# Patient Record
Sex: Female | Born: 1945 | Race: White | Hispanic: No | Smoking: Never smoker
Health system: Southern US, Community
[De-identification: ages and names within clinical notes are randomized; demographics above are authoritative.]

## PROBLEM LIST (undated history)

## (undated) DIAGNOSIS — F329 Major depressive disorder, single episode, unspecified: Secondary | ICD-10-CM

## (undated) DIAGNOSIS — E7800 Pure hypercholesterolemia, unspecified: Secondary | ICD-10-CM

## (undated) DIAGNOSIS — F419 Anxiety disorder, unspecified: Secondary | ICD-10-CM

## (undated) DIAGNOSIS — F32A Depression, unspecified: Secondary | ICD-10-CM

## (undated) HISTORY — PX: ABDOMINAL HYSTERECTOMY: SHX81

---

## 2012-07-31 ENCOUNTER — Emergency Department (HOSPITAL_COMMUNITY)
Admission: EM | Admit: 2012-07-31 | Discharge: 2012-07-31 | Disposition: A | Discharge status: Home / Self Care | Payer: Medicare Other | Attending: Emergency Medicine | Admitting: Emergency Medicine

## 2012-07-31 ENCOUNTER — Emergency Department (HOSPITAL_COMMUNITY): Payer: Medicare Other

## 2012-07-31 ENCOUNTER — Encounter (HOSPITAL_COMMUNITY): Payer: Self-pay | Admitting: Emergency Medicine

## 2012-07-31 ENCOUNTER — Inpatient Hospital Stay (HOSPITAL_COMMUNITY)
Admission: AD | Admit: 2012-07-31 | Discharge: 2012-08-03 | DRG: 897 | Disposition: A | Discharge status: Home / Self Care | Payer: Medicare Other | Source: Ambulatory Visit | Attending: Psychiatry | Admitting: Psychiatry

## 2012-07-31 DIAGNOSIS — G4700 Insomnia, unspecified: Secondary | ICD-10-CM | POA: Insufficient documentation

## 2012-07-31 DIAGNOSIS — F329 Major depressive disorder, single episode, unspecified: Secondary | ICD-10-CM | POA: Insufficient documentation

## 2012-07-31 DIAGNOSIS — F1010 Alcohol abuse, uncomplicated: Secondary | ICD-10-CM

## 2012-07-31 DIAGNOSIS — F39 Unspecified mood [affective] disorder: Secondary | ICD-10-CM | POA: Insufficient documentation

## 2012-07-31 DIAGNOSIS — F32A Depression, unspecified: Secondary | ICD-10-CM

## 2012-07-31 DIAGNOSIS — F3289 Other specified depressive episodes: Secondary | ICD-10-CM | POA: Insufficient documentation

## 2012-07-31 DIAGNOSIS — Z7982 Long term (current) use of aspirin: Secondary | ICD-10-CM

## 2012-07-31 DIAGNOSIS — E7800 Pure hypercholesterolemia, unspecified: Secondary | ICD-10-CM | POA: Insufficient documentation

## 2012-07-31 DIAGNOSIS — Z79899 Other long term (current) drug therapy: Secondary | ICD-10-CM

## 2012-07-31 DIAGNOSIS — F1020 Alcohol dependence, uncomplicated: Principal | ICD-10-CM | POA: Diagnosis present

## 2012-07-31 DIAGNOSIS — R45851 Suicidal ideations: Secondary | ICD-10-CM | POA: Insufficient documentation

## 2012-07-31 DIAGNOSIS — F411 Generalized anxiety disorder: Secondary | ICD-10-CM | POA: Insufficient documentation

## 2012-07-31 HISTORY — DX: Anxiety disorder, unspecified: F41.9

## 2012-07-31 HISTORY — DX: Major depressive disorder, single episode, unspecified: F32.9

## 2012-07-31 HISTORY — DX: Pure hypercholesterolemia, unspecified: E78.00

## 2012-07-31 HISTORY — DX: Depression, unspecified: F32.A

## 2012-07-31 LAB — POCT I-STAT, CHEM 8
Calcium, Ion: 1.09 mmol/L — ABNORMAL LOW (ref 1.13–1.30)
Chloride: 108 mEq/L (ref 96–112)
Glucose, Bld: 99 mg/dL (ref 70–99)
HCT: 39 % (ref 36.0–46.0)

## 2012-07-31 LAB — RAPID URINE DRUG SCREEN, HOSP PERFORMED
Amphetamines: NOT DETECTED
Benzodiazepines: NOT DETECTED
Cocaine: NOT DETECTED
Opiates: NOT DETECTED
Tetrahydrocannabinol: NOT DETECTED

## 2012-07-31 LAB — CBC WITH DIFFERENTIAL/PLATELET
Basophils Relative: 1 % (ref 0–1)
Hemoglobin: 13.3 g/dL (ref 12.0–15.0)
Lymphocytes Relative: 37 % (ref 12–46)
Lymphs Abs: 2.4 10*3/uL (ref 0.7–4.0)
Monocytes Relative: 7 % (ref 3–12)
Neutro Abs: 3.5 10*3/uL (ref 1.7–7.7)
Neutrophils Relative %: 55 % (ref 43–77)
RBC: 4.25 MIL/uL (ref 3.87–5.11)
WBC: 6.4 10*3/uL (ref 4.0–10.5)

## 2012-07-31 MED ORDER — ONDANSETRON HCL 8 MG PO TABS: 4.0000 mg | ORAL_TABLET | Freq: Three times a day (TID) | ORAL | Status: DC | PRN

## 2012-07-31 MED ORDER — SERTRALINE HCL 50 MG PO TABS
50.0000 mg | ORAL_TABLET | Freq: Every day | ORAL | Status: DC
  Filled 2012-07-31 (×3): qty 1

## 2012-07-31 MED ORDER — ALUM & MAG HYDROXIDE-SIMETH 200-200-20 MG/5ML PO SUSP: 30.0000 mL | ORAL | Status: DC | PRN

## 2012-07-31 MED ORDER — ONDANSETRON 4 MG PO TBDP: 4.0000 mg | ORAL_TABLET | Freq: Four times a day (QID) | ORAL | Status: DC | PRN

## 2012-07-31 MED ORDER — CHLORDIAZEPOXIDE HCL 25 MG PO CAPS: 25.0000 mg | ORAL_CAPSULE | Freq: Every day | ORAL | Status: DC

## 2012-07-31 MED ORDER — MAGNESIUM HYDROXIDE 400 MG/5ML PO SUSP: 30.0000 mL | Freq: Every day | ORAL | Status: DC | PRN

## 2012-07-31 MED ORDER — ACETAMINOPHEN 325 MG PO TABS
650.0000 mg | ORAL_TABLET | Freq: Four times a day (QID) | ORAL | Status: DC | PRN
  Administered 2012-08-01: 650 mg via ORAL

## 2012-07-31 MED ORDER — CHLORDIAZEPOXIDE HCL 25 MG PO CAPS
25.0000 mg | ORAL_CAPSULE | Freq: Four times a day (QID) | ORAL | Status: AC
  Administered 2012-08-01 – 2012-08-02 (×5): 25 mg via ORAL
  Filled 2012-07-31 (×4): qty 1

## 2012-07-31 MED ORDER — ASPIRIN 81 MG PO CHEW
324.0000 mg | CHEWABLE_TABLET | Freq: Once | ORAL | Status: AC
  Administered 2012-07-31: 324 mg via ORAL
  Filled 2012-07-31: qty 3
  Filled 2012-07-31: qty 1
  Filled 2012-07-31: qty 4

## 2012-07-31 MED ORDER — CHLORDIAZEPOXIDE HCL 25 MG PO CAPS
25.0000 mg | ORAL_CAPSULE | Freq: Once | ORAL | Status: AC
  Administered 2012-07-31: 25 mg via ORAL
  Filled 2012-07-31: qty 1

## 2012-07-31 MED ORDER — LORAZEPAM 1 MG PO TABS
1.0000 mg | ORAL_TABLET | Freq: Three times a day (TID) | ORAL | Status: DC | PRN
  Administered 2012-07-31 (×2): 1 mg via ORAL
  Filled 2012-07-31 (×2): qty 1

## 2012-07-31 MED ORDER — LOPERAMIDE HCL 2 MG PO CAPS: 2.0000 mg | ORAL_CAPSULE | ORAL | Status: DC | PRN

## 2012-07-31 MED ORDER — CHLORDIAZEPOXIDE HCL 25 MG PO CAPS
25.0000 mg | ORAL_CAPSULE | Freq: Three times a day (TID) | ORAL | Status: AC
  Administered 2012-08-02 (×3): 25 mg via ORAL
  Filled 2012-07-31 (×3): qty 1

## 2012-07-31 MED ORDER — CHLORDIAZEPOXIDE HCL 25 MG PO CAPS
25.0000 mg | ORAL_CAPSULE | ORAL | Status: DC
  Administered 2012-08-03: 25 mg via ORAL
  Filled 2012-07-31: qty 1

## 2012-07-31 MED ORDER — ACETAMINOPHEN 325 MG PO TABS: 650.0000 mg | ORAL_TABLET | ORAL | Status: DC | PRN

## 2012-07-31 MED ORDER — THIAMINE HCL 100 MG/ML IJ SOLN: 100.0000 mg | Freq: Once | INTRAMUSCULAR | Status: DC

## 2012-07-31 MED ORDER — VITAMIN B-1 100 MG PO TABS
100.0000 mg | ORAL_TABLET | Freq: Every day | ORAL | Status: DC
  Administered 2012-08-01 – 2012-08-03 (×3): 100 mg via ORAL
  Filled 2012-07-31 (×4): qty 1

## 2012-07-31 MED ORDER — HYDROXYZINE HCL 25 MG PO TABS
25.0000 mg | ORAL_TABLET | Freq: Four times a day (QID) | ORAL | Status: DC | PRN
  Administered 2012-08-02: 25 mg via ORAL

## 2012-07-31 MED ORDER — NICOTINE 21 MG/24HR TD PT24
21.0000 mg | MEDICATED_PATCH | Freq: Every day | TRANSDERMAL | Status: DC
  Filled 2012-07-31: qty 1

## 2012-07-31 MED ORDER — ADULT MULTIVITAMIN W/MINERALS CH
1.0000 | ORAL_TABLET | Freq: Every day | ORAL | Status: DC
  Administered 2012-08-01 – 2012-08-03 (×3): 1 via ORAL
  Filled 2012-07-31 (×4): qty 1

## 2012-07-31 MED ORDER — TRAZODONE HCL 50 MG PO TABS
50.0000 mg | ORAL_TABLET | Freq: Every evening | ORAL | Status: DC | PRN
  Administered 2012-07-31 – 2012-08-02 (×3): 50 mg via ORAL
  Filled 2012-07-31 (×9): qty 1

## 2012-07-31 MED ORDER — CHLORDIAZEPOXIDE HCL 25 MG PO CAPS
25.0000 mg | ORAL_CAPSULE | Freq: Four times a day (QID) | ORAL | Status: DC | PRN
Start: 2012-07-31 — End: 2012-08-03

## 2012-07-31 MED ORDER — IBUPROFEN 200 MG PO TABS: 600.0000 mg | ORAL_TABLET | Freq: Three times a day (TID) | ORAL | Status: DC | PRN

## 2012-07-31 NOTE — ED Provider Notes (Signed)
History     CSN: [id number]  Arrival date & time 07/31/12  0023   First MD Initiated Contact with Patient 07/31/12 0032      Chief Complaint  Patient presents with  . Chest Pain    (Consider location/radiation/quality/duration/timing/severity/associated sxs/prior treatment) Patient is a 66 y.o. female presenting with chest pain and mental health disorder. The history is provided by the patient. No language interpreter was used.  Chest Pain The chest pain began 1 - 2 weeks ago. Chest pain occurs constantly. The chest pain is unchanged. The severity of the pain is severe. The quality of the pain is described as dull. The pain radiates to the left shoulder. Exacerbated by: none. Pertinent negatives for primary symptoms include no shortness of breath, no cough, no wheezing and no nausea.  Pertinent negatives for associated symptoms include no claudication and no diaphoresis. She tried nothing for the symptoms. Risk factors include being elderly.  Pertinent negatives for past medical history include no MI and no seizures.  Procedure history is negative for cardiac catheterization.    Mental Health Problem The primary symptoms include dysphoric mood. Episode onset: unknown.  The onset of the illness is precipitated by a stressful event (taking care of husband). Additional symptoms of the illness include insomnia. Additional symptoms of the illness do not include no appetite change or no seizures. She admits to suicidal ideas. She contemplates harming herself. She has not already injured self. She does not contemplate injuring another person. She has not already  injured another person. Risk factors that are present for mental illness include a history of mental illness and substance abuse.  Told EMS she wanted to hurt self and pain got worse when on phone with daughter at EMS arrival and the daughters were reportedly concerned about her mental health.  Admits to drinking 2/5 of vodka on Sunday.   States now she is fine and wants to go home   Past Medical History  Diagnosis Date  . High cholesterol   . Depression   . Anxiety     Past Surgical History  Procedure Date  . Abdominal hysterectomy     No family history on file.  History  Substance Use Topics  . Smoking status: Not on file  . Smokeless tobacco: Not on file  . Alcohol Use:     OB History    Grav Para Term Preterm Abortions TAB SAB Ect Mult Living                  Review of Systems  Constitutional: Negative for diaphoresis and appetite change.  Respiratory: Negative for cough, shortness of breath and wheezing.   Cardiovascular: Positive for chest pain. Negative for claudication.  Gastrointestinal: Negative for nausea.  Neurological: Negative for seizures.  Psychiatric/Behavioral: Positive for dysphoric mood. The patient has insomnia.   All other systems reviewed and are negative.    Allergies  Penicillins  Home Medications   Current Outpatient Rx  Name  Route  Sig  Dispense  Refill  . ASPIRIN 81 MG PO CHEW   Oral   Chew 324 mg by mouth once.           There were no vitals taken for this visit.  Physical Exam  Constitutional: She is oriented to person, place, and time. She appears well-developed and well-nourished.  HENT:  Head: Normocephalic and atraumatic.  Mouth/Throat: Oropharynx is clear and moist.  Eyes: Conjunctivae normal are normal. Pupils are equal, round, and reactive to light.  Neck: Normal range of motion. Neck supple.  Cardiovascular: Normal rate and regular rhythm.   Pulmonary/Chest: Effort normal and breath sounds normal. She has no wheezes. She has no rales.  Abdominal: Soft. Bowel sounds are normal. There is no tenderness. There is no rebound and no guarding.  Musculoskeletal: Normal range of motion.  Neurological: She is alert and oriented to person, place, and time.  Skin: Skin is warm and dry. She is not diaphoretic.  Psychiatric: Her mood appears anxious. Her  affect is labile.    ED Course  Procedures (including critical care time)   Labs Reviewed  CBC WITH DIFFERENTIAL  ETHANOL  URINE RAPID DRUG SCREEN (HOSP PERFORMED)   No results found.   No diagnosis found.    MDM   Date: 07/31/2012  Rate: 91  Rhythm: normal sinus rhythm  QRS Axis: normal  Intervals: normal  ST/T Wave abnormalities: normal  Conduction Disutrbances: none  Narrative Interpretation: unremarkable     There is a lead in the position mentioned on chest xray, no indication for CT at this time  Seen by act team and patient needs admission       Marc Sivertsen K Domnic Vantol-Rasch, MD 07/31/12 7829

## 2012-07-31 NOTE — ED Notes (Signed)
Pt belongings inventoried and placed in [place] [address].

## 2012-07-31 NOTE — BH Assessment (Signed)
Assessment Note   Wendy Becker is an 66 y.o. female. Pt came to [facility] complaining of chest pain but also reported to EMS that she was suicidal.  PT BAC currently 219 and her report changes throughout the assessment.  Pt admits to SI with thoughts of overdose.  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  Pt reports she never gets a break from this and has "no life" and this has resulted in her feeling quite depressed and currently suicidal.  Pt also reports significant alcohol use: states she has been drinking a fifth vodka per day for the past week but unable to provide more history than this.  Pt had a DUI last year.  Pt denies HI/AV.  Pt was initially quite open about her depression/SI but when told she would be admitted she reported she did not want inpt treatment and could not do this.  Pt intoxication appeared to cause her story to jump around some.  It was explained to pt that she could not leave due to the safety concerns.  Pt does report withdrawal symptoms related to her alcohol use.  Pt has been treated for alcohol several times, including last year at [facility].    Axis I: Major Depression, single episode and Substance Abuse Axis II: Deferred Axis III:  Past Medical History  Diagnosis Date  . High cholesterol   . Depression   . 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 High cholesterol   . Depression   . Anxiety     Past Surgical History  Procedure Date  . Abdominal hysterectomy     Family History: No family history on file.  Social History:  does not have a smoking history on file. She does not have any smokeless tobacco history on file. Her alcohol and drug histories not on file.  Additional Social History:  Alcohol / Drug Use History  of alcohol / drug use?: Yes Negative Consequences of Use: Financial;Legal;Personal relationships Withdrawal Symptoms: Nausea / Vomiting;Sweats Substance #1 Name of Substance 1: vodka 1 - Age of First Use: 38 1 - Amount (size/oz): fifth  1 - Frequency: daily 1 - Duration: 1 week 1 - Last Use / Amount: 11/16, 2 fifths  CIWA: CIWA-Ar BP: 146/85 mmHg Nausea and Vomiting: intermittent nausea with dry heaves Tactile Disturbances: none Tremor: no tremor Auditory Disturbances: not present Paroxysmal Sweats: three Visual Disturbances: moderate sensitivity Anxiety: three Headache, Fullness in Head: mild Agitation: normal activity Orientation and Clouding of Sensorium: oriented and can do serial additions CIWA-Ar Total: 15  COWS:    Allergies:  Allergies  Allergen Reactions  . Penicillins Rash    Home Medications:  (Not in a hospital admission)  OB/GYN Status:  No LMP recorded.  General Assessment Data Location of Assessment: [facility] ED ACT Assessment: Yes Living Arrangements: Other (Comment) (ex husband) Can pt return to current living arrangement?: Yes  Education Status Is patient currently in school?: No  Risk to self Suicidal Ideation: Yes-Currently Present Suicidal Intent: No Is patient at risk for suicide?: Yes Suicidal Plan?: Yes-Currently Present Specify Current Suicidal Plan: overdose Access to Means: Yes Specify Access to Suicidal Means: pills What has been your use of drugs/alcohol within the last 12 months?: current  use of alcohol Previous Attempts/Gestures: No Intentional Self Injurious Behavior: None Family Suicide History: No Recent stressful life event(s): Other (Comment) (providing 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 ex husband, has "no life.") Persecutory voices/beliefs?: No Depression: Yes Depression Symptoms: Despondent;Insomnia;Tearfulness;Isolating;Guilt;Loss of interest in usual pleasures;Feeling worthless/self pity;Feeling angry/irritable Substance abuse history  and/or treatment for substance abuse?: Yes Suicide prevention information given to non-admitted patients: Not applicable  Risk to Others Homicidal Ideation: No Thoughts of Harm to Others: No Current Homicidal Intent: No Current Homicidal Plan: No Access to Homicidal Means: No History of harm to others?: No Assessment of Violence: None Noted Does patient have access to weapons?: Yes (Comment) (guns in home) Criminal Charges Pending?: No Does patient have a court date: No  Psychosis Hallucinations: None noted Delusions: None noted  Mental Status Report Appear/Hygiene: Disheveled Eye Contact: Poor Motor Activity: Unremarkable Speech: Logical/coherent Level of Consciousness: Quiet/awake Mood: Other (Comment) (cooperative) Affect: Fearful Anxiety Level: Moderate Thought Processes: Relevant Judgement: Impaired (currently intoxicated) Orientation: Person;Place;Time;Situation Obsessive Compulsive Thoughts/Behaviors: None  Cognitive Functioning Concentration: Normal Memory: Recent Intact;Remote Intact IQ: Average Insight: Fair Impulse Control: Poor Appetite: Poor Weight Loss: 0  Weight Gain: 0  Sleep: Decreased Total Hours of Sleep: 5  Vegetative Symptoms: None  ADLScreening [facility] Assessment Services) Patient's cognitive ability adequate to safely complete daily activities?: Yes Patient able to express need for assistance with ADLs?: Yes Independently performs ADLs?: Yes (appropriate for developmental age)  Abuse/Neglect [facility]) Physical Abuse: Denies Verbal Abuse: Denies Sexual Abuse: Denies  Prior Inpatient Therapy Prior Inpatient Therapy: Yes Prior Therapy Dates: 2006 Prior Therapy Facilty/Provider(s): [place] Reason for Treatment: alcohol  Prior Outpatient Therapy Prior Outpatient Therapy: Yes Prior Therapy Dates: 2012 Prior Therapy Facilty/Provider(s): [facility] Reason for Treatment: DUI  ADL Screening (condition at time of admission) Patient's  cognitive ability adequate to safely complete daily activities?: Yes Patient able to express need for assistance with ADLs?: Yes Independently performs ADLs?: Yes (appropriate for developmental age) Weakness of Legs: None Weakness of Arms/Hands: None  Home Assistive Devices/Equipment Home Assistive Devices/Equipment: None    Abuse/Neglect Assessment (Assessment to be complete while patient is alone) Physical Abuse: Denies Verbal Abuse: Denies Sexual Abuse: Denies Exploitation of patient/patient's resources: Denies Self-Neglect: Denies Values / Beliefs Cultural Requests During Hospitalization: None Spiritual Requests During Hospitalization: None   Advance Directives (For Healthcare) Advance Directive: Patient does not have advance directive;Patient would like information    Additional Information 1:1 In Past 12 Months?: No CIRT Risk: No Elopement Risk: Yes Does patient have medical clearance?: Yes     Disposition: Discussed this pt with Dr Saralyn Pilar of [facility] who agrees pt requires inpt psych treatment at this point.  Pt info sent to [facility] for review. Disposition Disposition of Patient: Inpatient treatment program Type of inpatient treatment program: Adult  On Site Evaluation by:   Reviewed with Physician:     Lorri Frederick 07/31/2012 2:18 AM

## 2012-07-31 NOTE — ED Notes (Signed)
Patient transported to X-ray 

## 2012-07-31 NOTE — Progress Notes (Signed)
Patient ID: Wendy Becker, female   DOB: 1946/05/25, 66 y.o.   MRN: [id number] Patient is a 66 year old female who comes in for help with depression and ETOH abuse. She reports moving here from [place] three years ago and has not established a new social circle. 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 She expresses a strong desire to do this. Patient has been feeling overwhelmed and drinking ETOH to cope. Reports drinking uncertain amount of wine and fifth of vodka daily. She was very anxious during the admission and is preoccupied with who will be taking care of ex-husband. The patient has tremors, mild sweats, headache but denies GI upset. Received ativan prior to coming to [facility]. Patient reports only medical history is depression and high cholesterol. Quit zoloft several months ago due to it not working well for her. Patient endorses SI only when drinking. Denies currently and contracts for safety. Patient plans to find a church after d/c. Has two daughters who are very supportive of her. Patient oriented to the 300 hall unit and routine. Reports being inpatient at [facility] for depression in the past.

## 2012-07-31 NOTE — Tx Team (Signed)
Initial Interdisciplinary Treatment Plan  PATIENT STRENGTHS: (choose at least two) Ability for insight Average or above average intelligence Capable of independent living General fund of knowledge Supportive family/friends  PATIENT STRESSORS: Financial difficulties Marital or family conflict Substance abuse   PROBLEM LIST: Problem List/Patient Goals Date to be addressed Date deferred Reason deferred Estimated date of resolution  Depression      ETOH abuse      Risk for self harm                                           DISCHARGE CRITERIA:  Ability to meet basic life and health needs Improved stabilization in mood, thinking, and/or behavior Motivation to continue treatment in a less acute level of care Verbal commitment to aftercare and medication compliance Withdrawal symptoms are absent or subacute and managed without 24-hour nursing intervention  PRELIMINARY DISCHARGE PLAN: Attend aftercare/continuing care group Attend 12-step recovery group Outpatient therapy Return to previous living arrangement  PATIENT/FAMIILY INVOLVEMENT: This treatment plan has been presented to and reviewed with the patient, Wendy Becker, and/or family member.  The patient and family have been given the opportunity to ask questions and make suggestions.  Jesus Genera [facility] 07/31/2012, 11:53 PM

## 2012-07-31 NOTE — ED Notes (Signed)
Per EMS- Pt states she has been having chest for over a week. States that pain radiates to her L arm "feels like a pressure." Patient reports nausea and vomiting. Reports drinking two bottles of Vodka. Currently states she is not having any pain, pt is currently requesting to go home. Patient mentioned to EMS that she wanted to hurt herself but now denies this, states she was just joking. Alertx4, NAD.

## 2012-08-01 ENCOUNTER — Encounter (HOSPITAL_COMMUNITY): Payer: Self-pay | Admitting: Psychiatry

## 2012-08-01 DIAGNOSIS — F329 Major depressive disorder, single episode, unspecified: Secondary | ICD-10-CM

## 2012-08-01 DIAGNOSIS — F1994 Other psychoactive substance use, unspecified with psychoactive substance-induced mood disorder: Secondary | ICD-10-CM

## 2012-08-01 DIAGNOSIS — F1010 Alcohol abuse, uncomplicated: Secondary | ICD-10-CM

## 2012-08-01 LAB — URINALYSIS, ROUTINE W REFLEX MICROSCOPIC
Bilirubin Urine: NEGATIVE
Glucose, UA: NEGATIVE mg/dL
Hgb urine dipstick: NEGATIVE
Ketones, ur: 15 mg/dL — AB
Nitrite: NEGATIVE
pH: 8 (ref 5.0–8.0)

## 2012-08-01 LAB — HEPATIC FUNCTION PANEL
Albumin: 4.6 g/dL (ref 3.5–5.2)
Alkaline Phosphatase: 96 U/L (ref 39–117)
Total Bilirubin: 1.3 mg/dL — ABNORMAL HIGH (ref 0.3–1.2)

## 2012-08-01 LAB — URINE MICROSCOPIC-ADD ON

## 2012-08-01 LAB — TSH: TSH: 5.308 u[IU]/mL — ABNORMAL HIGH (ref 0.350–4.500)

## 2012-08-01 MED ORDER — ESCITALOPRAM OXALATE 10 MG PO TABS
5.0000 mg | ORAL_TABLET | Freq: Every day | ORAL | Status: DC
  Administered 2012-08-01 – 2012-08-03 (×3): 5 mg via ORAL
  Filled 2012-08-01 (×3): qty 0.5
  Filled 2012-08-01: qty 1
  Filled 2012-08-01: qty 0.5

## 2012-08-01 NOTE — Progress Notes (Signed)
[facility] Adult Inpatient Family/Significant Other Suicide Prevention Education  Suicide Prevention Education:  Education Completed; Kathrynn Humble - daughter [phone]),  (name of family member/significant other) has been identified by the patient as the family member/significant other with whom the patient will be residing, and identified as the person(s) who will aid the patient in the event of a mental health crisis (suicidal ideations/suicide attempt).  With written consent from the patient, the family member/significant other has been provided the following suicide prevention education, prior to the and/or following the discharge of the patient.  The suicide prevention education provided includes the following:  Suicide risk factors  Suicide prevention and interventions  National Suicide Hotline telephone number  [facility] assessment telephone number  [facility] Emergency Assistance 911  [facility] and/or Residential Mobile Crisis Unit telephone number  Request made of family/significant other to:  Remove weapons (e.g., guns, rifles, knives), all items previously/currently identified as safety concern.    Remove drugs/medications (over-the-counter, prescriptions, illicit drugs), all items previously/currently identified as a safety concern.  The family member/significant other verbalizes understanding of the suicide prevention education information provided.  The family member/significant other agrees to remove the items of safety concern listed above.  Wendy Becker 08/01/2012, 10:31 AM

## 2012-08-01 NOTE — [facility] Suicide Risk Assessment (Signed)
Suicide Risk Assessment  Admission Assessment     Nursing information obtained from:  Patient Demographic factors:  Age 68 or older;Divorced or widowed;Caucasian Current Mental Status:  Suicidal ideation indicated by patient Loss Factors:  Decrease in vocational status Historical Factors:  NA Risk Reduction Factors:  Sense of responsibility to family;Religious beliefs about death;Positive therapeutic relationship  CLINICAL FACTORS:   Depression:   Comorbid alcohol abuse/dependence Alcohol/Substance Abuse/Dependencies  COGNITIVE FEATURES THAT CONTRIBUTE TO RISK: No evidence   SUICIDE RISK:   Mild:  Suicidal ideation of limited frequency, intensity, duration, and specificity.  There are no identifiable plans, no associated intent, mild dysphoria and related symptoms, good self-control (both objective and subjective assessment), few other risk factors, and identifiable protective factors, including available and accessible social support.  PLAN OF CARE: Detox                              Reassess co morbidities                              Coping skills/stress management/relapse prevention   Reggie Bise A 08/01/2012, 8:34 AM

## 2012-08-01 NOTE — Clinical Social Work Note (Addendum)
Aftercare Planning Group: 08/01/2012 9:45 AM  Pt attended discharge planning group and actively participated in group.  CSW provided pt with today's workbook.  Pt presents with flat affect and depressed mood.  Pt denies having depression and anxiety today.  Pt denies SI/HI.  Pt states that she came to the hospital for depression and alcohol use.  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  Pt states that she doesn't have a psychiatrist or therapist in the area.  CSW will assess for appropriate referrals.  No further needs voiced by pt at this time.  Safety planning and suicide prevention discussed.  Pt participated in discussion and acknowledged an understanding of the information provided.       [facility] Group Note : Clinical Social Worker Group Therapy  08/01/2012  1:15 PM  Type of Therapy:  Group Therapy - Process Group  Participation Level:  Appropriate  Participation Quality:  Appropriate   Affect:  Appropriate  Cognitive:  Alert  Insight:  Good  Engagement in Group:  Good  Engagement in Therapy:  Good  Modes of Intervention:  Clarification, Education, Orientation, Problem-solving, Socialization and Support  Summary of Progress/Problems: Patient was attentive and engaged with speaker from [employer].  Patient expressed interest in their programs and services.  Patient processed ways they can relate to the speaker.   Pt discussed that this would be a good option for her for support vs AA.     Murle Hellstrom Horton, LCSWA 08/01/2012 3:00 pm

## 2012-08-01 NOTE — H&P (Signed)
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 She was in [place] for 7 years, moved back to help him out. Has bee too much. SHe does everything for him. She has suffered from depression, has been on Zoloft. She quit it as she felt it was not helping. She got "too stressed." Started drinking more to cope. She experienced increased anxiety, having some physical symptoms, (panic like,) she came to the ED. She was assessed at the ED, says she admitted she has thought about killing herself Mood Symptoms:  Depression, Depression Symptoms:  depressed mood, anhedonia, insomnia, anxiety, insomnia, disturbed sleep, (Hypo) Manic Symptoms:  Denies Anxiety Symptoms:  Excessive Worry, Psychotic Symptoms:  Denies  PTSD Symptoms: Denies   Past Psychiatric History: Diagnosis:Alcohol Abuse/Dependence , Depressive Disorder NOS  Hospitalizations: 2006 in [place]. 30 day  Outpatient Care: [facility]  Substance Abuse Care: [facility]  Self-Mutilation:Denies  Suicidal Attempts:Denies  Violent Behaviors:Denies   Past Medical History:   Past Medical History  Diagnosis Date  . High cholesterol   . Depression   . Anxiety    None. Allergies:   Allergies  Allergen Reactions  . Penicillins Rash   PTA Medications: Prescriptions prior to admission  Medication Sig Dispense Refill  . aspirin 81 MG chewable tablet Chew 324 mg by mouth once.      . diphenhydrAMINE (SOMINEX) 25 MG tablet Take 25 mg by mouth at bedtime.      Marland Kitchen MELATONIN PO Take 3-4 tablets by mouth at bedtime.      . sertraline (ZOLOFT) 100 MG tablet Take 50 mg by mouth daily.        Previous Psychotropic Medications:  Medication/Dose  Zoloft  Lexapro  temazepam           Substance Abuse History in the  last 12 months: Substance Age of 1st Use Last Use Amount Specific Type  Nicotine      Alcohol 38 Binge cant have a glass of wine, Saturday  wine  Cannabis      Opiates      Cocaine      Methamphetamines      LSD      Ecstasy      Benzodiazepines      Caffeine      Inhalants      Others:                         Consequences of Substance Abuse: Legal Consequences:  2 DWI  Social History: Current Place of Residence:   Place of Birth:   Family Members: Marital Status:  Divorced Children:  Sons:1  Daughters:2 Relationships: Education:  HS Physiological scientist client care Religious Beliefs/Practices: History of Abuse (Emotional/Phsycial/Sexual) Occupational Experiences; Military History:  None. Legal History: Hobbies/Interests:  Family History:  No family history on file. Brother Addictions  Mental Status Examination/Evaluation: Objective:  Appearance: Casual  Eye Contact::  Good  Speech:  Clear and Coherent and Normal Rate  Volume:  Normal  Mood:  Worry, Depression  Affect:  Appropriate  Thought Process:  Coherent and Goal Directed  Orientation:  Full  Thought Content:  WDL  Suicidal Thoughts:  Has had thoughts, no plans, no intent  Homicidal Thoughts:  No  Memory:  Immediate;  Fair Recent;   Fair Remote;   Fair  Judgement:  Fair  Insight:  Fair  Psychomotor Activity:  Normal  Concentration:  Fair  Recall:  Fair  Akathisia:  No  Handed:  Right  AIMS (if indicated):     Assets:  Communication Skills Desire for Improvement Financial Resources/Insurance Housing Social Support Transportation  Sleep:  Number of Hours: 6     Laboratory/X-Ray Psychological Evaluation(s)      Assessment:    AXIS I:  Alcohol Abuse, R/O Dependence, Substance Induced mood disorder, Depressive Disorder NOS AXIS II:  Deferred AXIS III:   Past Medical History  Diagnosis Date  . High cholesterol   . Depression   . Anxiety      AXIS IV:  Demand from taking care of her ex-husband AXIS V:  51-60 moderate symptoms  Treatment Plan/Recommendations:  Treatment Plan Summary: Daily contact with patient to assess and evaluate symptoms and progress in treatment Medication management Detox, reassess mood,  Treat the depression depression Current Medications:  Current Facility-Administered Medications  Medication Dose Route Frequency Provider Last Rate Last Dose  . acetaminophen (TYLENOL) tablet 650 mg  650 mg Oral Q6H PRN Kerry Hough, PA      . alum & mag hydroxide-simeth (MAALOX/MYLANTA) 200-200-20 MG/5ML suspension 30 mL  30 mL Oral Q4H PRN Kerry Hough, PA      . [COMPLETED] aspirin chewable tablet 324 mg  324 mg Oral Once Kerry Hough, PA   324 mg at 07/31/12 2138  . chlordiazePOXIDE (LIBRIUM) capsule 25 mg  25 mg Oral Q6H PRN Kerry Hough, PA      . [COMPLETED] chlordiazePOXIDE (LIBRIUM) capsule 25 mg  25 mg Oral Once Kerry Hough, PA   25 mg at 07/31/12 2133  . chlordiazePOXIDE (LIBRIUM) capsule 25 mg  25 mg Oral QID Kerry Hough, PA       Followed by  . chlordiazePOXIDE (LIBRIUM) capsule 25 mg  25 mg Oral TID Kerry Hough, PA       Followed by  . chlordiazePOXIDE (LIBRIUM) capsule 25 mg  25 mg Oral BH-qamhs Kerry Hough, PA       Followed by  . chlordiazePOXIDE (LIBRIUM) capsule 25 mg  25 mg Oral Daily Kerry Hough, PA      . hydrOXYzine (ATARAX/VISTARIL) tablet 25 mg  25 mg Oral Q6H PRN Kerry Hough, PA      . loperamide (IMODIUM) capsule 2-4 mg  2-4 mg Oral PRN Kerry Hough, PA      . magnesium hydroxide (MILK OF MAGNESIA) suspension 30 mL  30 mL Oral Daily PRN Kerry Hough, PA      . multivitamin with minerals tablet 1 tablet  1 tablet Oral Daily Kerry Hough, PA      . ondansetron (ZOFRAN-ODT) disintegrating tablet 4 mg  4 mg Oral Q6H PRN Kerry Hough, PA      . sertraline (ZOLOFT) tablet 50 mg  50 mg Oral Daily Kerry Hough, PA      . thiamine (B-1) injection  100 mg  100 mg Intramuscular Once Kerry Hough, PA      . thiamine (VITAMIN B-1) tablet 100 mg  100 mg Oral Daily Kerry Hough, PA      . traZODone (DESYREL) tablet 50 mg  50 mg Oral QHS,MR X 1 Kerry Hough, PA   50 mg at 07/31/12 2133  . [DISCONTINUED] nicotine (NICODERM CQ - dosed  in mg/24 hours) patch 21 mg  21 mg Transdermal Q0600 Kerry Hough, PA       Facility-Administered Medications Ordered in Other Encounters  Medication Dose Route Frequency Provider Last Rate Last Dose  . [DISCONTINUED] acetaminophen (TYLENOL) tablet 650 mg  650 mg Oral Q4H PRN April K Palumbo-Rasch, MD      . [DISCONTINUED] alum & mag hydroxide-simeth (MAALOX/MYLANTA) 200-200-20 MG/5ML suspension 30 mL  30 mL Oral PRN April K Palumbo-Rasch, MD      . [DISCONTINUED] ibuprofen (ADVIL,MOTRIN) tablet 600 mg  600 mg Oral Q8H PRN April K Palumbo-Rasch, MD      . [DISCONTINUED] LORazepam (ATIVAN) tablet 1 mg  1 mg Oral Q8H PRN April K Palumbo-Rasch, MD   1 mg at 07/31/12 1237  . [DISCONTINUED] ondansetron (ZOFRAN) tablet 4 mg  4 mg Oral Q8H PRN April K Palumbo-Rasch, MD        Observation Level/Precautions:  AWOL  Laboratory:  As per ED  Psychotherapy:  Individual/ Group Therapy/Coping skills  Medications:  Librium Detox  Routine PRN Medications:  Yes  Consultations:    Discharge Concerns:    Other:     Romeka Scifres A 11/19/20138:05 AM

## 2012-08-01 NOTE — Progress Notes (Signed)
Pt is new admit to the hall for detox from ETOH.  Pt reports she went to the ED for chest pain, but admitted to staff that she was depressed and was having some passive thoughts to kill herself.  She also was intoxicated and admitted to drinking daily.  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  She says she really doesn't have a social life and has not met any new people since moving here from [place] a few years ago to take care of her ex.  She presents as flat/sad.  Pt denies SI/HI/AV at this time.  She denies feeling any withdrawal symptoms at this time, but her BP is elevated.  She was started on the Librium protocol and given Trazodone for sleep.  Pt encouraged to make his needs known to staff.  Safety maintained with q15 minute checks.

## 2012-08-01 NOTE — Progress Notes (Signed)
D: Patient denies SI/HI and A/V hallucinations; patient reports that she requested medication for sleep; reports appetite to be improving ; reports energy level is low; reports ability to pay attention to be improving; rates depression as 7/10; rates hopelessness 8/10; denies any anxiety  A: Monitored q 15 minutes; patient encouraged to attend groups; patient educated about medications; patient given medications per physician orders; patient encouraged to express feelings and/or concerns;librium protocol; lexapro started per physician order  R: Patient is cooperative and animated.; patient's interaction with staff and peers is appropriate;patient is taking medications as prescribed and tolerating medications; patient is attending all groups

## 2012-08-01 NOTE — Progress Notes (Signed)
  [facility] Group Notes:  (Counselor/Nursing/MHT/Case Management/Adjunct)  08/01/2012 12:32 PM  Type of Therapy:  Psychoeducational Skills  Participation Level:  Minimal  Participation Quality:  Appropriate and Attentive  Affect:  Appropriate and Blunted  Cognitive:  Appropriate and Oriented  Insight:  Good  Engagement in Group:  Good  Engagement in Therapy:  n/a  Modes of Intervention:  Activity, Education, Problem-solving, Socialization and Support  Summary of Progress/Problems: Janie attended a psycho-education group that focused on using quality time with support systems/individuals to engage in health coping skills. Janie participated in activity guessing about self and peers. Wille Celeste was quiet but attentive while group discussed who their supports are, how they can spend positive quality time with them as a coping skills and a way to strengthen their relationship. Wille Celeste was given a homework assignment to find two ways to improve her support systems and twenty activities she can do to spend quality time with her supports.    Wandra Scot 08/01/2012, 12:32 PM

## 2012-08-01 NOTE — [facility] Counselor (Signed)
Adult Comprehensive Assessment  Patient ID: Wendy Becker, female   DOB: Aug 25, 1946, 66 y.o.   MRN: [id number]  Information Source: Information source: Patient  Current Stressors:  Educational / Learning stressors: N/A Employment / Job issues: N/A Family Relationships: Living with ex husband and caretaker for him Surveyor, quantity / Lack of resources (include bankruptcy): N/A Housing / Lack of housing: N/A Physical health (include injuries & life threatening diseases): N/A Social relationships: N/A Substance abuse: Alcohol Use Bereavement / Loss: N/A  Living/Environment/Situation:  Living Arrangements: Other (Comment) (ex husband) Living conditions (as described by patient or guardian): Pt states that it can be 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 How long has patient lived in current situation?: 2.5 years What is atmosphere in current home: Comfortable;Other (Comment) (Stressful at times)  Family History:  Marital status: Divorced What types of issues is patient dealing with in the relationship?: Husband not supportive Additional relationship information: Live together now and is a caretaker for him Does patient have children?: Yes How many children?: 3  How is patient's relationship with their children?: Pt states that she has a great relationship with her children  Childhood History:  By whom was/is the patient raised?: Both parents Additional childhood history information: Pt states that she a good childhood and states that it was normal Description of patient's relationship with caregiver when they were a child: Pt states that she got along well with her parents Patient's description of current relationship with people who raised him/her: Pt states that parents are deceased Does patient have siblings?: No Did patient suffer any verbal/emotional/physical/sexual abuse as a child?: No Did patient suffer from severe childhood neglect?: No Has patient ever been  sexually abused/assaulted/raped as an adolescent or adult?: No Was the patient ever a victim of a crime or a disaster?: No Witnessed domestic violence?: No Has patient been effected by domestic violence as an adult?: No  Education:  Highest 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, molecular Currently a student?: No Learning disability?: No  Employment/Work Situation:   Employment situation: Unemployed Patient's job has been impacted by current illness: No What is the longest time patient has a held a job?: 6 years Where was the patient employed at that time?: [employer] Has patient ever been in the [employer]?: No Has patient ever served in Buyer, retail?: No  Financial Resources:   Surveyor, quantity resources: [employer];Medicare;Support from parents / caregiver Does patient have a representative payee or guardian?: No  Alcohol/Substance Abuse:   What has been your use of drugs/alcohol within the last 12 months?: Alcohol - pt states that she binges on a big bottle of wine or vodka occasionally If attempted suicide, did drugs/alcohol play a role in this?: No Alcohol/Substance Abuse Treatment Hx: Past Tx, Inpatient If yes, describe treatment: Rehab in [facility] ni 2006  Social Support System:   Patient's Community Support System: Good Describe Community Support System: Pt states that her children are supportive Type of faith/religion: Christian How does patient's faith help to cope with current illness?: Prayer and reading  Leisure/Recreation:   Leisure and Hobbies: Shopping  Strengths/Needs:   What things does the patient do well?: Taking care of people and talking with people In what areas does patient struggle / problems for patient: Depression and Alcohol Use  Discharge Plan:   Does patient have access to transportation?: Yes Will patient be returning to same living situation after discharge?: Yes Currently receiving community mental health services: No If no, would patient  like referral for services when discharged?: Yes (What county?) [facility]) Does patient have financial barriers related to discharge medications?: No  Summary/Recommendations:  Patient is a 66 year old Caucasian Female with a diagnosis of Alcohol Abuse and MDD.  Patient lives in [place] with her ex husband.  Patient will benefit from crisis stabilization, medication evaluation, group therapy and psycho education in addition to case management for discharge planning.      Horton, Salome Arnt. 08/01/2012

## 2012-08-01 NOTE — Progress Notes (Signed)
Psychoeducational Group Note  Date:  08/01/2012 Time:  1100  Group Topic/Focus:  Recovery Goals:   The focus of this group is to identify appropriate goals for recovery and establish a plan to achieve them.  Participation Level:  Active  Participation Quality:  Appropriate, Attentive, Sharing and Supportive  Affect:  Appropriate  Cognitive:  Appropriate  Insight:  Good  Engagement in Group:  Good  Additional Comments:  Pt participated in discussion about recovery goals and what changes she needs to make in order to make progress towards recovery. Pt was engaged in group stating she knows what goals she needs to make but would like to sit down later today to think about it.   Wendy Becker 08/01/2012, 2:42 PM

## 2012-08-02 DIAGNOSIS — F1020 Alcohol dependence, uncomplicated: Principal | ICD-10-CM

## 2012-08-02 MED ORDER — ASPIRIN 81 MG PO CHEW: 81.0000 mg | CHEWABLE_TABLET | Freq: Once | ORAL | Status: DC

## 2012-08-02 MED ORDER — TRAZODONE HCL 50 MG PO TABS: 50.0000 mg | ORAL_TABLET | Freq: Every evening | ORAL | Status: DC | PRN

## 2012-08-02 MED ORDER — ESCITALOPRAM OXALATE 5 MG PO TABS: 5.0000 mg | ORAL_TABLET | Freq: Every day | ORAL | Status: DC

## 2012-08-02 MED ORDER — HYDROXYZINE HCL 25 MG PO TABS: 25.0000 mg | ORAL_TABLET | Freq: Four times a day (QID) | ORAL | Status: DC | PRN

## 2012-08-02 MED ORDER — THIAMINE HCL 100 MG PO TABS: 100.0000 mg | ORAL_TABLET | Freq: Every day | ORAL | Status: DC

## 2012-08-02 NOTE — Clinical Social Work Note (Signed)
Aftercare Planning Group: 08/02/2012 9:45 AM  Pt attended discharge planning group and actively participated in group.  CSW provided pt with today's workbook.  Pt presents with calm mood and affect.  Pt denies having depression, anxiety and SI/HI.  Pt reports feeling ready to d/c tomorrow.  Pt has follow up scheduled at [facility] for medication management and therapy.  No further needs voiced by pt at this time.    [facility] Group Note : Clinical Social Worker Group Therapy  08/02/2012  1:15 PM  Type of Therapy:  Group Therapy - Process Group  Participation Level:  Appropriate  Participation Quality:  Appropriate   Affect:  Appropriate  Cognitive:  Alert  Insight:  Good  Engagement in Group:  Good  Engagement in Therapy:  Good  Modes of Intervention:  Clarification, Education, Problem-solving, Socialization and Support  Summary of Progress/Problems: The topic for group today was emotional regulation.  Pt actively listened to group discussion about past experiences and how they can work on living in today and moving forward with recovery.     Berdie Malter Horton, LCSWA 08/02/2012 3:00 pm

## 2012-08-02 NOTE — Progress Notes (Signed)
[facility] MD Progress Note  08/02/2012 3:55 PM Wendy Becker  MRN:  [id number]  Diagnosis:  Alcohol Dependence, Depressive Disorder  ADL's:  Intact  Sleep: Fair  Appetite:  Fair  Suicidal Ideation:  Plan:  Denies Intent:  Denies Means:  Denies Homicidal Ideation:  Plan:  Denies Intent:  Denies Means:  Denies  AEB (as evidenced by):  Mental Status Examination/Evaluation: Objective:  Appearance: Casual  Eye Contact::  Fair  Speech:  Clear and Coherent and Normal Rate  Volume:  Normal  Mood:  Worried  Affect:  Appropriate  Thought Process:  Coherent and Goal Directed  Orientation:  Full  Thought Content:  WDL  Suicidal Thoughts:  No  Homicidal Thoughts:  No  Memory:  Immediate;   Fair Recent;   Fair Remote;   Fair  Judgement:  Intact  Insight:  Fair  Psychomotor Activity:  Normal  Concentration:  Fair  Recall:  Fair  Akathisia:  No  Handed:  Right  AIMS (if indicated):     Assets:  Communication Skills Desire for Improvement Financial Resources/Insurance Housing Talents/Skills  Sleep:  Number of Hours: 6    Vital Signs:Blood pressure 100/67, pulse 106, temperature 97.8 F (36.6 C), temperature source Oral, resp. rate 16, height 5\' 3"  (1.6 m), weight 68.947 kg (152 lb). Current Medications: Current Facility-Administered Medications  Medication Dose Route Frequency Provider Last Rate Last Dose  . acetaminophen (TYLENOL) tablet 650 mg  650 mg Oral Q6H PRN Kerry Hough, PA   650 mg at 08/01/12 0852  . alum & mag hydroxide-simeth (MAALOX/MYLANTA) 200-200-20 MG/5ML suspension 30 mL  30 mL Oral Q4H PRN Kerry Hough, PA      . chlordiazePOXIDE (LIBRIUM) capsule 25 mg  25 mg Oral Q6H PRN Kerry Hough, PA      . [COMPLETED] chlordiazePOXIDE (LIBRIUM) capsule 25 mg  25 mg Oral QID Kerry Hough, PA   25 mg at 08/02/12 4098   Followed by  . chlordiazePOXIDE (LIBRIUM) capsule 25 mg  25 mg Oral TID Kerry Hough, PA   25 mg at 08/02/12 1204   Followed by  .  chlordiazePOXIDE (LIBRIUM) capsule 25 mg  25 mg Oral BH-qamhs Kerry Hough, PA       Followed by  . chlordiazePOXIDE (LIBRIUM) capsule 25 mg  25 mg Oral Daily Kerry Hough, PA      . escitalopram (LEXAPRO) tablet 5 mg  5 mg Oral Daily Rachael Fee, MD   5 mg at 08/02/12 1191  . hydrOXYzine (ATARAX/VISTARIL) tablet 25 mg  25 mg Oral Q6H PRN Kerry Hough, PA      . loperamide (IMODIUM) capsule 2-4 mg  2-4 mg Oral PRN Kerry Hough, PA      . magnesium hydroxide (MILK OF MAGNESIA) suspension 30 mL  30 mL Oral Daily PRN Kerry Hough, PA      . multivitamin with minerals tablet 1 tablet  1 tablet Oral Daily Kerry Hough, PA   1 tablet at 08/02/12 0742  . ondansetron (ZOFRAN-ODT) disintegrating tablet 4 mg  4 mg Oral Q6H PRN Kerry Hough, PA      . thiamine (B-1) injection 100 mg  100 mg Intramuscular Once Kerry Hough, PA      . thiamine (VITAMIN B-1) tablet 100 mg  100 mg Oral Daily Kerry Hough, PA   100 mg at 08/02/12 0742  . traZODone (DESYREL) tablet 50 mg  50 mg Oral QHS,MR  X 1 Kerry Hough, PA   50 mg at 08/01/12 2147    Lab Results:  Results for orders placed during the hospital encounter of 07/31/12 (from the past 48 hour(s))  URINALYSIS, ROUTINE W REFLEX MICROSCOPIC     Status: Abnormal   Collection Time   08/01/12  5:45 AM      Component Value Range Comment   Color, Urine YELLOW  YELLOW    APPearance CLEAR  CLEAR    Specific Gravity, Urine 1.018  1.005 - 1.030    pH 8.0  5.0 - 8.0    Glucose, UA NEGATIVE  NEGATIVE mg/dL    Hgb urine dipstick NEGATIVE  NEGATIVE    Bilirubin Urine NEGATIVE  NEGATIVE    Ketones, ur 15 (*) NEGATIVE mg/dL    Protein, ur NEGATIVE  NEGATIVE mg/dL    Urobilinogen, UA 1.0  0.0 - 1.0 mg/dL    Nitrite NEGATIVE  NEGATIVE    Leukocytes, UA TRACE (*) NEGATIVE   URINE MICROSCOPIC-ADD ON     Status: Normal   Collection Time   08/01/12  5:45 AM      Component Value Range Comment   Squamous Epithelial / LPF RARE  RARE    WBC, UA  0-2  <3 WBC/hpf    RBC / HPF 0-2  <3 RBC/hpf   HEPATIC FUNCTION PANEL     Status: Abnormal   Collection Time   08/01/12  6:20 AM      Component Value Range Comment   Total Protein 7.8  6.0 - 8.3 g/dL    Albumin 4.6  3.5 - 5.2 g/dL    AST 62 (*) 0 - 37 U/L    ALT 52 (*) 0 - 35 U/L    Alkaline Phosphatase 96  39 - 117 U/L    Total Bilirubin 1.3 (*) 0.3 - 1.2 mg/dL    Bilirubin, Direct 0.2  0.0 - 0.3 mg/dL    Indirect Bilirubin 1.1 (*) 0.3 - 0.9 mg/dL   TSH     Status: Abnormal   Collection Time   08/01/12  6:20 AM      Component Value Range Comment   TSH 5.308 (*) 0.350 - 4.500 uIU/mL     Physical Findings: AIMS: Facial and Oral Movements Muscles of Facial Expression: None, normal Lips and Perioral Area: None, normal Jaw: None, normal Tongue: None, normal,Extremity Movements Upper (arms, wrists, hands, fingers): None, normal Lower (legs, knees, ankles, toes): None, normal, Trunk Movements Neck, shoulders, hips: None, normal, Overall Severity Severity of abnormal movements (highest score from questions above): None, normal Incapacitation due to abnormal movements: None, normal Patient's awareness of abnormal movements (rate only patient's report): No Awareness, Dental Status Current problems with teeth and/or dentures?: No Does patient usually wear dentures?: No  CIWA:  CIWA-Ar Total: 0  COWS:     Treatment Plan Summary: Daily contact with patient to assess and evaluate symptoms and progress in treatment Medication management  Plan: Supportive approach/coping skills/relapse prevention           Finish Detox  [name] A 08/02/2012, 3:55 PM

## 2012-08-02 NOTE — Tx Team (Signed)
Interdisciplinary Treatment Plan Update (Adult)  Date:  08/02/2012  Time Reviewed:  10:05 AM   Progress in Treatment: Attending groups: Yes Participating in groups:  Yes Taking medication as prescribed: Yes Tolerating medication:  Yes Family/Significant othe contact made: Yes Patient understands diagnosis:  Yes Discussing patient identified problems/goals with staff:  Yes Medical problems stabilized or resolved:  Yes Denies suicidal/homicidal ideation: Yes Issues/concerns per patient self-inventory:  None identified Other: N/A  New problem(s) identified: None Identified  Reason for Continuation of Hospitalization: Anxiety Depression Medication stabilization  Interventions implemented related to continuation of hospitalization: mood stabilization, medication monitoring and adjustment, group therapy and psycho education, suicide risk assessment, collateral contact, aftercare planning, ongoing physician assessments and safety checks q 15 mins  Additional comments: N/A  Estimated length of stay: 1 day  Discharge Plan: Pt has follow up scheduled at [facility] for medication management and therapy.     New goal(s): N/A  Review of initial/current patient goals per problem list:    1.  Goal(s): Address substance use by completing detox protocol  Met:  Yes  Target date: 4 days  As evidenced by: Will complete detox protocol and has no w/d symptoms  2.  Goal (s): Reduce depressive symptoms from a 10 to a 3  Met:  Yes  Target date: 3-5 days  As evidenced by: Pt rates at a 1 today.    3.  Goal (s): Reduce anxiety symptoms from a 10 to a 3  Met:  Yes  Target date:  3-5 days  As evidenced by: Pt rates at a 1 today.    4.  Goal(s): Eliminate SI  Met:  Yes  Target date:   As evidenced by: Pt denies SI.     Attendees: Patient:     Family:     Physician: Geoffery Lyons, MD 08/02/2012 10:05 AM   Nursing: Roswell Miners, RN 08/02/2012 10:05 AM     Clinical Social Worker:  Reyes Ivan, LCSWA 08/02/2012  10:05 AM   Other: Burnetta Sabin, RN 08/02/2012  10:05 AM   Other:  Nanine Means, NP 08/02/2012 10:07 AM   Other:  Bubba Camp, Psyc intern 08/02/2012 10:07 AM   Other:     Other:      Scribe for Treatment Team:   Reyes Ivan 08/02/2012 10:05 AM

## 2012-08-02 NOTE — Progress Notes (Signed)
D: Patient denies SI/HI/AVH. Patient rates hopelessness as 1,  depression as 1, and anxiety as 1.  Patient affect is animated. Mood is appropriate.  Pt states "I feel great since I have a plan.  I am going to start going to support groups and church.  I realize that I have to take care of myself first before I can take care of him."  Patient did attend evening group. Patient visible on the milieu. No distress noted. A: Support and encouragement offered. Scheduled medications given to pt. Q 15 min checks continued for patient safety. R: Patient receptive. Patient remains safe on the unit.

## 2012-08-02 NOTE — Progress Notes (Signed)
D: Patient denies SI/HI and A/V hallucinations; patient reports sleep to be well; reports appetite to be good ; reports energy level is normal ; reports ability to pay attention to be good; rates depression as 2/10; rates hopelessness 1/10;   A: Monitored q 15 minutes; patient encouraged to attend groups; patient educated about medications; patient given medications per physician orders; patient encouraged to express feelings and/or concerns; librium protocol  R: Patient remains.cooperative and pleasant but minimizes current situation; patient's interaction with staff and peers patient was able to set goal to talk with staff 1:1 when having feelings of SI; patient is taking medications as prescribed and tolerating medications; patient is attending all groups

## 2012-08-02 NOTE — Progress Notes (Signed)
Psychoeducational Group Note  Date:  08/02/2012 Time:  2000  Group Topic/Focus:  AA group  Participation Level:  Active  Participation Quality:  Appropriate  Affect:  Appropriate  Cognitive:  Appropriate  Insight:  Good  Engagement in Group:  Good  Additional Comments:    Gwenevere Ghazi Patience 08/02/2012, 11:01 PM

## 2012-08-03 DIAGNOSIS — F329 Major depressive disorder, single episode, unspecified: Secondary | ICD-10-CM

## 2012-08-03 NOTE — Progress Notes (Signed)
[facility] Case Management Discharge Plan:  Will you be returning to the same living situation after discharge: Yes,  returning home At discharge, do you have transportation home?:Yes,  daughter will pick pt up today Do you have the ability to pay for your medications:Yes,  access to meds   Release of information consent forms completed and in the chart;  Patient's signature needed at discharge.  Patient to Follow up at:  Follow-up Information    Follow up with [facility]. On 08/07/2012. (Appointment scheduled at 2:00 pm with Michaelle Copas)    Contact information:   [address] [place], [place] [address] [phone]         Patient denies SI/HI:   Yes,  denies SI/HI    Safety Planning and Suicide Prevention discussed:  Yes,  discussed with pt today  Barrier to discharge identified:No.  Summary and Recommendations: Pt attended discharge planning group and actively participated in group.  CSW provided pt with today's workbook.  Pt presents with calm mood and affect.  Pt denies having depression, anxiety and SI/HI.  Pt reports feeling stable to d/c today.  No recommendations from CSW.  No further needs voiced by pt.  Pt stable to discharge.    Carmina Miller 08/03/2012, 10:54 AM

## 2012-08-03 NOTE — Progress Notes (Signed)
Patient in bed not sleeping during this writer's round. She reported feeling all right and getting ready for sleep. Writer encouraged patient to notify writer if she needs anything. Patient receptive to encouragement.  Q 15 minute check continues as ordered to maintain safety.

## 2012-08-03 NOTE — [facility] Suicide Risk Assessment (Signed)
Suicide Risk Assessment  Discharge Assessment     Demographic Factors:  Caucasian  Mental Status Per Nursing Assessment::   On Admission:  Suicidal ideation indicated by patient  Current Mental Status by Physician: In full contact with reality. There are no suicidal ideas, plans or intent. Her mood is improved, her affect is bright, broad. She is insightful in terms of what she needs to do to take care of herself. She plans to  develop a better balance of activities in her life.  Plans to abstain from using alcohol to cope   Loss Factors: NA  Historical Factors: NA  Risk Reduction Factors:   Sense of responsibility to family, Religious beliefs about death and Positive social support  Continued Clinical Symptoms:  Depression:   Comorbid alcohol abuse/dependence Alcohol/Substance Abuse/Dependencies  Cognitive Features That Contribute To Risk: None Identified   Suicide Risk:  Minimal: No identifiable suicidal ideation.  Patients presenting with no risk factors but with morbid ruminations; may be classified as minimal risk based on the severity of the depressive symptoms  Discharge Diagnoses:   AXIS I:  Alcohol Dependence, Major Depression AXIS II:  Deferred AXIS III:   Past Medical History  Diagnosis Date  . High cholesterol   . Depression   . Anxiety    AXIS IV:  Husband's illness AXIS V:  61-70 mild symptoms  Plan Of Care/Follow-up recommendations:  Activity:  As tolerated Diet:  Regular Follow up outpatient therapy/[facility]/AA  Is patient on multiple antipsychotic therapies at discharge:  No   Has Patient had three or more failed trials of antipsychotic monotherapy by history:  No  Recommended Plan for Multiple Antipsychotic Therapies: N/A   Hunter Pinkard A 08/03/2012, 9:27 AM

## 2012-08-03 NOTE — Progress Notes (Signed)
D:  Patient discharged to home today.  All belongings retrieved from room.  Patient denies depression, hopelessness, anxiety or suicidal ideations.  Appetite and sleep are reported as good.  Patient attended all scheduled groups this morning.  A:  Reviewed all discharge instructions, medications, and follow up care with patient.  Retrieved items from locker number 26.  Escorted patient to the front lobby where her daughter was waiting to pick her up.   R:  Patient verbalized understanding of all discharge information.  States she feels she is ready to leave and understands her follow up plans and her medications.  Pleasant and cooperative with staff and peers.

## 2012-08-07 NOTE — Progress Notes (Signed)
Patient Discharge Instructions:  After Visit Summary (AVS):   Faxed to:  08/07/12 Psychiatric Admission Assessment Note:   Faxed to:  08/07/12 Suicide Risk Assessment - Discharge Assessment:   Faxed to:  08/07/12 Faxed/Sent to the Next Level Care provider:  08/07/12 Faxed to [facility] @ [phone]  Jerelene Redden, 08/07/2012, 3:11 PM

## 2012-08-08 NOTE — Discharge Summary (Signed)
Physician Discharge Summary Note  Patient:  Wendy Becker is an 66 y.o., female MRN:  [id number] DOB:  Sep 27, 1945 Patient phone:  [phone] (home)  Patient address:   [address] [place] [address],   Date of Admission:  07/31/2012 Date of Discharge: 08/08/2012  Reason for Admission:  Patient depressed, suicidal, alcohol abuse  Discharge Diagnoses: Active Problems:  Alcohol abuse  Depressive disorder  Axis Diagnosis:  AXIS I:  Alcohol Abuse and Depressive Disorder NOS AXIS II:  Deferred AXIS III:   Past Medical History  Diagnosis Date  . High cholesterol   . Depression   . Anxiety    AXIS IV:  other psychosocial or environmental problems, problems related to social environment and problems with primary support group AXIS V:  61-70 mild symptoms  Level of Care:  OP  Hospital Course:   Patient attended individual and group therapy while inpatient along with attending AA groups, one-one time with MD daily, medications for detox managed during inpatient, follow-up appointments made prior to discharge   Consults:  None  Significant Diagnostic Studies:  labs: Completed and reviewed in ED, stable  Discharge Vitals:   Blood pressure 101/70, pulse 102, temperature 98 F (36.7 C), temperature source Oral, resp. rate 16, height 5\' 3"  (1.6 m), weight 68.947 kg (152 lb). Lab Results:   No results found for this or any previous visit (from the past 72 hour(s)).  Physical Findings: AIMS: Facial and Oral Movements Muscles of Facial Expression: None, normal Lips and Perioral Area: None, normal Jaw: None, normal Tongue: None, normal,Extremity Movements Upper (arms, wrists, hands, fingers): None, normal Lower (legs, knees, ankles, toes): None, normal, Trunk Movements Neck, shoulders, hips: None, normal, Overall Severity Severity of abnormal movements (highest score from questions above): None, normal Incapacitation due to abnormal movements: None,  normal Patient's awareness of abnormal movements (rate only patient's report): No Awareness, Dental Status Current problems with teeth and/or dentures?: No Does patient usually wear dentures?: No  CIWA:  CIWA-Ar Total: 0  COWS:     Mental Status Exam: See Mental Status Examination and Suicide Risk Assessment completed by Attending Physician prior to discharge.  Discharge destination:  Home  Is patient on multiple antipsychotic therapies at discharge:  No   Has Patient had three or more failed trials of antipsychotic monotherapy by history:  No Recommended Plan for Multiple Antipsychotic Therapies: N/A  Discharge Orders    Future Orders Please Complete By Expires   Diet - low sodium heart healthy      Diet - low sodium heart healthy      Activity as tolerated - No restrictions      Increase activity slowly      Discharge instructions      Comments:   Take all of your medications as prescribed.  Be sure to keep ALL follow up appointments as scheduled. This is to ensure getting your refills on time to avoid any interruption in your medication.  If you find that you can not keep your appointment, call the clinic and reschedule. Be sure to tell the nurse if you will need a refill before your appointment.       Medication List     As of 08/08/2012  1:44 PM    STOP taking these medications         diphenhydrAMINE 25 MG tablet   Commonly known as: SOMINEX      MELATONIN PO      sertraline 100 MG  tablet   Commonly known as: ZOLOFT      TAKE these medications      Indication    aspirin 81 MG chewable tablet   Chew 1 tablet (81 mg total) by mouth once.    Indication: thrombosis prophylatic      escitalopram 5 MG tablet   Commonly known as: LEXAPRO   Take 1 tablet (5 mg total) by mouth daily.    Indication: Depression, Generalized Anxiety Disorder      hydrOXYzine 25 MG tablet   Commonly known as: ATARAX/VISTARIL   Take 1 tablet (25 mg total) by mouth every 6 (six) hours  as needed for anxiety (or CIWA score </= 10).       thiamine 100 MG tablet   Take 1 tablet (100 mg total) by mouth daily.    Indication: Deficiency in Thiamine or Vitamin B1      traZODone 50 MG tablet   Commonly known as: DESYREL   Take 1 tablet (50 mg total) by mouth at bedtime and may repeat dose one time if needed.    Indication: Trouble Sleeping           Follow-up Information    Follow up with [facility]. On 08/07/2012. (Appointment scheduled at 2:00 pm with Michaelle Copas)    Contact information:   [address] [place], [place] [address] [phone]        Follow-up recommendations:  Activity as tolerated, low-sodium heart healthy diet   Comments:  Patient denied suicidal/homicidal ideations and auditory/visual hallucinations, follow-up appointments encouraged to attend, outside support groups encouraged and information given   Signed: Nanine Means, PMH-NP 08/08/2012, 1:44 PM

## 2012-08-18 NOTE — Discharge Summary (Signed)
Agree with assessment and plan Sharron Petruska A. Crescencio Jozwiak, M.D. 

## 2014-06-15 ENCOUNTER — Emergency Department (HOSPITAL_COMMUNITY)
Admission: EM | Admit: 2014-06-15 | Discharge: 2014-06-15 | Disposition: A | Discharge status: Home / Self Care | Payer: Medicare Other | Attending: Emergency Medicine | Admitting: Emergency Medicine

## 2014-06-15 ENCOUNTER — Emergency Department (HOSPITAL_COMMUNITY): Payer: Medicare Other

## 2014-06-15 ENCOUNTER — Encounter (HOSPITAL_COMMUNITY): Payer: Self-pay | Admitting: Emergency Medicine

## 2014-06-15 DIAGNOSIS — Y9389 Activity, other specified: Secondary | ICD-10-CM | POA: Diagnosis not present

## 2014-06-15 DIAGNOSIS — Z79899 Other long term (current) drug therapy: Secondary | ICD-10-CM | POA: Diagnosis not present

## 2014-06-15 DIAGNOSIS — F329 Major depressive disorder, single episode, unspecified: Secondary | ICD-10-CM | POA: Insufficient documentation

## 2014-06-15 DIAGNOSIS — Z880 Allergy status to penicillin: Secondary | ICD-10-CM | POA: Diagnosis not present

## 2014-06-15 DIAGNOSIS — Z8639 Personal history of other endocrine, nutritional and metabolic disease: Secondary | ICD-10-CM | POA: Insufficient documentation

## 2014-06-15 DIAGNOSIS — S298XXA Other specified injuries of thorax, initial encounter: Secondary | ICD-10-CM | POA: Insufficient documentation

## 2014-06-15 DIAGNOSIS — Y92410 Unspecified street and highway as the place of occurrence of the external cause: Secondary | ICD-10-CM | POA: Diagnosis not present

## 2014-06-15 DIAGNOSIS — F419 Anxiety disorder, unspecified: Secondary | ICD-10-CM | POA: Insufficient documentation

## 2014-06-15 DIAGNOSIS — R0789 Other chest pain: Secondary | ICD-10-CM

## 2014-06-15 MED ORDER — CYCLOBENZAPRINE HCL 10 MG PO TABS: 10.0000 mg | ORAL_TABLET | Freq: Three times a day (TID) | ORAL | Status: AC | PRN

## 2014-06-15 MED ORDER — HYDROCODONE-ACETAMINOPHEN 5-325 MG PO TABS: 1.0000 | ORAL_TABLET | Freq: Four times a day (QID) | ORAL | Status: AC | PRN

## 2014-06-15 MED ORDER — IBUPROFEN 800 MG PO TABS: 800.0000 mg | ORAL_TABLET | Freq: Three times a day (TID) | ORAL | Status: AC | PRN

## 2014-06-15 MED ORDER — OXYCODONE-ACETAMINOPHEN 5-325 MG PO TABS
1.0000 | ORAL_TABLET | Freq: Once | ORAL | Status: AC
  Administered 2014-06-15: 1 via ORAL
  Filled 2014-06-15: qty 1

## 2014-06-15 NOTE — Discharge Instructions (Signed)
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
